# Patient Record
Sex: Female | Born: 2000 | Race: Black or African American | Hispanic: No | Marital: Single | Smoking: Never smoker
Health system: Southern US, Community
[De-identification: ages and names within clinical notes are randomized; demographics above are authoritative.]

---
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
## 2018-01-29 DIAGNOSIS — S70312A Abrasion, left thigh, initial encounter: Secondary | ICD-10-CM | POA: Diagnosis not present

## 2018-02-11 DIAGNOSIS — Z3042 Encounter for surveillance of injectable contraceptive: Secondary | ICD-10-CM | POA: Diagnosis not present

## 2018-02-11 DIAGNOSIS — Z113 Encounter for screening for infections with a predominantly sexual mode of transmission: Secondary | ICD-10-CM | POA: Diagnosis not present

## 2018-03-30 DIAGNOSIS — Z113 Encounter for screening for infections with a predominantly sexual mode of transmission: Secondary | ICD-10-CM | POA: Diagnosis not present

## 2018-04-02 DIAGNOSIS — K130 Diseases of lips: Secondary | ICD-10-CM | POA: Diagnosis not present

## 2018-04-02 DIAGNOSIS — T7840XA Allergy, unspecified, initial encounter: Secondary | ICD-10-CM | POA: Diagnosis not present

## 2018-04-02 DIAGNOSIS — H5789 Other specified disorders of eye and adnexa: Secondary | ICD-10-CM | POA: Diagnosis not present

## 2018-05-11 DIAGNOSIS — Z30013 Encounter for initial prescription of injectable contraceptive: Secondary | ICD-10-CM | POA: Diagnosis not present

## 2019-02-03 ENCOUNTER — Other Ambulatory Visit: Payer: Self-pay

## 2019-02-03 DIAGNOSIS — Z20828 Contact with and (suspected) exposure to other viral communicable diseases: Secondary | ICD-10-CM | POA: Diagnosis not present

## 2019-02-03 DIAGNOSIS — Z20822 Contact with and (suspected) exposure to covid-19: Secondary | ICD-10-CM

## 2019-02-04 LAB — NOVEL CORONAVIRUS, NAA: SARS-CoV-2, NAA: NOT DETECTED

## 2019-02-05 ENCOUNTER — Emergency Department (HOSPITAL_COMMUNITY)
Admission: EM | Admit: 2019-02-05 | Discharge: 2019-02-05 | Disposition: A | Discharge status: Home / Self Care | Payer: Medicaid Other | Attending: Emergency Medicine | Admitting: Emergency Medicine

## 2019-02-05 ENCOUNTER — Encounter (HOSPITAL_COMMUNITY): Payer: Self-pay

## 2019-02-05 ENCOUNTER — Other Ambulatory Visit: Payer: Self-pay

## 2019-02-05 DIAGNOSIS — Z041 Encounter for examination and observation following transport accident: Secondary | ICD-10-CM | POA: Insufficient documentation

## 2019-02-05 DIAGNOSIS — Y999 Unspecified external cause status: Secondary | ICD-10-CM | POA: Diagnosis not present

## 2019-02-05 DIAGNOSIS — Y92411 Interstate highway as the place of occurrence of the external cause: Secondary | ICD-10-CM | POA: Diagnosis not present

## 2019-02-05 DIAGNOSIS — Y9389 Activity, other specified: Secondary | ICD-10-CM | POA: Insufficient documentation

## 2019-02-05 DIAGNOSIS — M62838 Other muscle spasm: Secondary | ICD-10-CM | POA: Diagnosis not present

## 2019-02-05 MED ORDER — CYCLOBENZAPRINE HCL 10 MG PO TABS: 10.0000 mg | ORAL_TABLET | Freq: Two times a day (BID) | ORAL | 0 refills | Status: DC | PRN

## 2019-02-05 NOTE — Discharge Instructions (Addendum)
Please use Tylenol and ibuprofen for pain control.  Use your prescribed muscle relaxer for nighttime use as it will make you drowsy.  Do not mix with alcohol.  Please return for any new or concerning symptoms.  You may wish to follow-up with your primary care provider in the next 2 to 3 days to reassess her symptoms.
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
## 2019-02-15 DIAGNOSIS — Z113 Encounter for screening for infections with a predominantly sexual mode of transmission: Secondary | ICD-10-CM | POA: Diagnosis not present

## 2019-02-15 DIAGNOSIS — R300 Dysuria: Secondary | ICD-10-CM | POA: Diagnosis not present

## 2019-02-15 DIAGNOSIS — N760 Acute vaginitis: Secondary | ICD-10-CM | POA: Diagnosis not present

## 2019-02-18 DIAGNOSIS — Z20828 Contact with and (suspected) exposure to other viral communicable diseases: Secondary | ICD-10-CM | POA: Diagnosis not present

## 2019-03-01 DIAGNOSIS — Z20828 Contact with and (suspected) exposure to other viral communicable diseases: Secondary | ICD-10-CM | POA: Diagnosis not present

## 2019-03-03 DIAGNOSIS — Z20828 Contact with and (suspected) exposure to other viral communicable diseases: Secondary | ICD-10-CM | POA: Diagnosis not present

## 2019-04-12 DIAGNOSIS — Z114 Encounter for screening for human immunodeficiency virus [HIV]: Secondary | ICD-10-CM | POA: Diagnosis not present

## 2019-04-12 DIAGNOSIS — Z113 Encounter for screening for infections with a predominantly sexual mode of transmission: Secondary | ICD-10-CM | POA: Diagnosis not present

## 2019-05-24 ENCOUNTER — Encounter (HOSPITAL_COMMUNITY): Payer: Self-pay | Admitting: *Deleted

## 2019-05-24 ENCOUNTER — Emergency Department (HOSPITAL_COMMUNITY): Payer: Medicaid Other

## 2019-05-24 ENCOUNTER — Other Ambulatory Visit: Payer: Self-pay

## 2019-05-24 ENCOUNTER — Emergency Department (HOSPITAL_COMMUNITY)
Admission: EM | Admit: 2019-05-24 | Discharge: 2019-05-24 | Disposition: A | Discharge status: Home / Self Care | Payer: Medicaid Other | Attending: Emergency Medicine | Admitting: Emergency Medicine

## 2019-05-24 DIAGNOSIS — Z20822 Contact with and (suspected) exposure to covid-19: Secondary | ICD-10-CM | POA: Diagnosis not present

## 2019-05-24 DIAGNOSIS — R0789 Other chest pain: Secondary | ICD-10-CM | POA: Diagnosis not present

## 2019-05-24 DIAGNOSIS — R0602 Shortness of breath: Secondary | ICD-10-CM | POA: Diagnosis not present

## 2019-05-24 LAB — POC SARS CORONAVIRUS 2 AG -  ED: SARS Coronavirus 2 Ag: NEGATIVE

## 2019-05-24 MED ORDER — ACETAMINOPHEN 500 MG PO TABS
1000.0000 mg | ORAL_TABLET | Freq: Once | ORAL | Status: AC
  Administered 2019-05-24: 1000 mg via ORAL
  Filled 2019-05-24: qty 2

## 2019-05-24 MED ORDER — ALBUTEROL SULFATE HFA 108 (90 BASE) MCG/ACT IN AERS
2.0000 | INHALATION_SPRAY | Freq: Once | RESPIRATORY_TRACT | Status: AC
  Administered 2019-05-24: 12:00:00 2 via RESPIRATORY_TRACT
  Filled 2019-05-24: qty 6.7

## 2019-05-24 MED ORDER — ALBUTEROL SULFATE (2.5 MG/3ML) 0.083% IN NEBU: 5.0000 mg | INHALATION_SOLUTION | Freq: Once | RESPIRATORY_TRACT | Status: DC

## 2019-05-24 NOTE — ED Provider Notes (Signed)
[facility]-EMERGENCY DEPT Provider Note   CSN: [id number] Arrival date & time: 05/24/19  1139     History Chief Complaint  Patient presents with  . Shortness of Breath    Meghan Little is a 19 y.o. female.  Meghan Little is a 19 y.o. female who is otherwise healthy, presents to the emergency department for evaluation of shortness of breath.  She reports yesterday evening while at work she noticed some mild shortness of breath and chest tightness that was intermittent.  She was able to sleep through the night without difficulty, also noticed some sneezing and nasal congestion.  Has not noticed a cough.  Denies any chest pain or pleuritic pain.  No pain in her back.  No associated abdominal pain, nausea, vomiting or diaphoresis.  She has not had any fevers, but does report some chills and fatigue.  Denies body aches or weakness.  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  No meds prior to arrival to treat her symptoms, states that she has had to use an inhaler before but has never been formally diagnosed with asthma.  No other aggravating or alleviating factors.        History reviewed. No pertinent past medical history.  There are no problems to display for this patient.   History reviewed. No pertinent surgical history.   OB History   No obstetric history on file.     No family history on file.  Social History   Tobacco Use  . Smoking status: Never Smoker  . Smokeless tobacco: Never Used  Substance Use Topics  . Alcohol use: Yes    Comment: "occasional"  . Drug use: Never    Home Medications Prior to Admission medications   Medication Sig Start Date End Date Taking? Authorizing Provider  cyclobenzaprine (FLEXERIL) 10 MG tablet Take 1 tablet (10 mg total) by mouth 2 (two) times daily as needed for muscle spasms. 02/05/19   Gailen Shelter, PA    Allergies    Penicillins  Review of Systems   Review  of Systems  Constitutional: Positive for chills. Negative for fever.  HENT: Positive for congestion and sneezing. Negative for sore throat.   Respiratory: Positive for chest tightness and shortness of breath. Negative for cough.   Cardiovascular: Negative for chest pain.  Gastrointestinal: Negative for abdominal pain, nausea and vomiting.  Musculoskeletal: Negative for arthralgias and myalgias.  Skin: Negative for color change and rash.  Neurological: Negative for dizziness and light-headedness.  All other systems reviewed and are negative.   Physical Exam Updated Vital Signs BP 119/70 (BP Location: Right Arm)   Pulse 79   Temp 99.1 F (37.3 C) (Oral)   Resp 16   Ht 5\' 6"  (1.676 m)   Wt 54.9 kg   SpO2 100%   BMI 19.53 kg/m   Physical Exam Vitals and nursing note reviewed.  Constitutional:      General: She is not in acute distress.    Appearance: She is well-developed and normal weight. She is not ill-appearing or diaphoretic.  HENT:     Head: Normocephalic and atraumatic.     Nose: Congestion and rhinorrhea present.     Mouth/Throat:     Mouth: Mucous membranes are moist.     Pharynx: Oropharynx is clear. No oropharyngeal exudate or posterior oropharyngeal erythema.  Eyes:     General:        Right eye: No discharge.  Left eye: No discharge.     Pupils: Pupils are equal, round, and reactive to light.  Cardiovascular:     Rate and Rhythm: Normal rate and regular rhythm.     Heart sounds: Normal heart sounds. No murmur. No friction rub. No gallop.   Pulmonary:     Effort: Pulmonary effort is normal. No respiratory distress.     Breath sounds: Normal breath sounds. No wheezing or rales.     Comments: Respirations equal and unlabored, patient able to speak in full sentences, lungs clear to auscultation bilaterally Chest:     Chest wall: No tenderness.  Abdominal:     General: Bowel sounds are normal. There is no distension.     Palpations: Abdomen is soft. There  is no mass.     Tenderness: There is no abdominal tenderness. There is no guarding.     Comments: Abdomen soft, nondistended, nontender to palpation in all quadrants without guarding or peritoneal signs  Musculoskeletal:        General: No deformity.     Cervical back: Neck supple.     Right lower leg: No edema.     Left lower leg: No edema.  Skin:    General: Skin is warm and dry.     Capillary Refill: Capillary refill takes less than 2 seconds.  Neurological:     Mental Status: She is alert.     Coordination: Coordination normal.     Comments: Speech is clear, able to follow commands Moves extremities without ataxia, coordination intact  Psychiatric:        Mood and Affect: Mood normal.        Behavior: Behavior normal.     ED Results / Procedures / Treatments   Labs (all labs ordered are listed, but only abnormal results are displayed) Labs Reviewed  NOVEL CORONAVIRUS, NAA (HOSP ORDER, SEND-OUT TO REF LAB; TAT 18-24 HRS)  POC SARS CORONAVIRUS 2 AG -  ED    EKG EKG Interpretation  Date/Time:  Tuesday May 24 2019 11:53:31 EST Ventricular Rate:  86 PR Interval:    QRS Duration: 80 QT Interval:  340 QTC Calculation: 407 R Axis:   51 Text Interpretation: Sinus rhythm No old tracing to compare Confirmed by Lorre Nick ([id number]) on 05/25/2019 5:33:27 PM   Radiology DG Chest Port 1 View  Result Date: 05/24/2019 CLINICAL DATA:  Shortness of breath beginning yesterday. EXAM: PORTABLE CHEST 1 VIEW COMPARISON:  None. FINDINGS: The heart size and mediastinal contours are within normal limits. Both lungs are clear. The visualized skeletal structures are unremarkable. IMPRESSION: No active disease. Electronically Signed   By: Paulina Fusi M.D.   On: 05/24/2019 12:38    Procedures Procedures (including critical care time)  Medications Ordered in ED Medications  albuterol (VENTOLIN HFA) 108 (90 Base) MCG/ACT inhaler 2 puff (2 puffs Inhalation Given 05/24/19 1221)   acetaminophen (TYLENOL) tablet 1,000 mg (1,000 mg Oral Given 05/24/19 1220)    ED Course  I have reviewed the triage vital signs and the nursing notes.  Pertinent labs & imaging results that were available during my care of the patient were reviewed by me and considered in my medical decision making (see chart for details).    MDM Rules/Calculators/A&P                      19 year old female presents with some intermittent episodes of mild shortness of breath which began last night associated with some nasal congestion and  sneezing, no chest pain or cough noted, has had some chills but no fever.  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  No associated abdominal pain.  Patient denies any pleuritic pain, shortness of breath is not brought on by exertion.  Her EKG shows sinus rhythm with no concerning changes and her chest x-ray is clear.  Lungs are clear on exam.  Will test patient for Covid but I have low suspicion for PE as patient is PERC negative, no associated chest pain.  Patient's symptoms resolved with albuterol and Tylenol.  Rapid Covid was negative, PCR sent.  Discussed appropriate symptomatic treatment, home quarantine and return precautions, patient expresses understanding and agreement with plan.  Discharged home in good condition.  Meghan Little was evaluated in Emergency Department on 05/27/2019 for the symptoms described in the history of present illness. She was evaluated in the context of the global COVID-19 pandemic, which necessitated consideration that the patient might be at risk for infection with the SARS-CoV-2 virus that causes COVID-19. Institutional protocols and algorithms that pertain to the evaluation of patients at risk for COVID-19 are in a state of rapid change based on information released by regulatory bodies including the CDC and federal and state organizations. These policies and algorithms were followed during the patient's care in  the ED.  Final Clinical Impression(s) / ED Diagnoses Final diagnoses:  SOB (shortness of breath)  Encounter for laboratory testing for COVID-19 virus    Rx / DC Orders ED Discharge Orders    None       Janet Berlin 05/27/19 1131    Milton Ferguson, MD 05/30/19 [phone]

## 2019-05-24 NOTE — ED Triage Notes (Signed)
Yesterday noticed SHOB, today some sneezing,

## 2019-05-24 NOTE — Discharge Instructions (Signed)
Your chest x-ray is clear, your EKG looks good and your rapid Covid test was negative, you have a confirmatory Covid test pending and will be called in 2 to 3 days if results are positive, if results are negative they can be reviewed through your MyChart.  Please follow the instructions on your paperwork today to do this.  You should quarantine at home until you receive your test results.  I suspect your symptoms are related to a mild viral infection, you can use Tylenol, the albuterol inhaler you were given today, ibuprofen and over-the-counter cold and flu medications to treat your symptoms.  If you develop worsening shortness of breath, start experiencing chest pain or any other new or concerning symptoms please return to the ED.

## 2019-06-07 DIAGNOSIS — Z113 Encounter for screening for infections with a predominantly sexual mode of transmission: Secondary | ICD-10-CM | POA: Diagnosis not present

## 2019-08-08 DIAGNOSIS — N760 Acute vaginitis: Secondary | ICD-10-CM | POA: Diagnosis not present

## 2019-09-30 DIAGNOSIS — Z114 Encounter for screening for human immunodeficiency virus [HIV]: Secondary | ICD-10-CM | POA: Diagnosis not present

## 2019-09-30 DIAGNOSIS — Z113 Encounter for screening for infections with a predominantly sexual mode of transmission: Secondary | ICD-10-CM | POA: Diagnosis not present

## 2019-10-21 ENCOUNTER — Other Ambulatory Visit: Payer: Self-pay

## 2019-10-21 ENCOUNTER — Encounter (HOSPITAL_COMMUNITY): Payer: Self-pay

## 2019-10-21 ENCOUNTER — Ambulatory Visit (INDEPENDENT_AMBULATORY_CARE_PROVIDER_SITE_OTHER): Payer: Medicaid Other

## 2019-10-21 ENCOUNTER — Ambulatory Visit (HOSPITAL_COMMUNITY)
Admission: EM | Admit: 2019-10-21 | Discharge: 2019-10-21 | Disposition: A | Discharge status: Home / Self Care | Payer: Medicaid Other | Attending: Emergency Medicine | Admitting: Emergency Medicine

## 2019-10-21 DIAGNOSIS — S60221A Contusion of right hand, initial encounter: Secondary | ICD-10-CM | POA: Diagnosis not present

## 2019-10-21 DIAGNOSIS — M79641 Pain in right hand: Secondary | ICD-10-CM | POA: Diagnosis not present

## 2019-10-21 MED ORDER — IBUPROFEN 800 MG PO TABS: 800.0000 mg | ORAL_TABLET | Freq: Three times a day (TID) | ORAL | 0 refills | Status: AC

## 2019-10-21 NOTE — ED Triage Notes (Addendum)
Pt presents with right thumb and wrist pain after "hiiting something last night". Pt concerned for sprain. Pt noted to have full ROM in thumb and wrist with pain. Right hand non swollen. Pt denies OTC treatment or relieving factors. Pt noted to be using cell phone with affected hand.

## 2019-10-21 NOTE — ED Provider Notes (Signed)
[facility]    CSN: [id number] Arrival date & time: 10/21/19  1230      History   Chief Complaint Chief Complaint  Patient presents with  . Hand Pain    HPI Meghan Little is a 19 y.o. female significant past medical history presenting today for evaluation of right hand injury.  Patient reports that she had computer screen last night and since has had pain to her first through third fingers along with MCP joints.  Denies significant wrist pain.  Denies prior fractures.  HPI  History reviewed. No pertinent past medical history.  There are no problems to display for this patient.   History reviewed. No pertinent surgical history.  OB History   No obstetric history on file.      Home Medications    Prior to Admission medications   Medication Sig Start Date End Date Taking? Authorizing Provider  ibuprofen (ADVIL) 800 MG tablet Take 1 tablet (800 mg total) by mouth 3 (three) times daily. 10/21/19   Harutyun Monteverde, Elesa Hacker, PA-C    Family History History reviewed. No pertinent family history.  Social History Social History   Tobacco Use  . Smoking status: Never Smoker  . Smokeless tobacco: Never Used  Substance Use Topics  . Alcohol use: Yes    Comment: "occasional"  . Drug use: Never     Allergies   Penicillins   Review of Systems Review of Systems  Constitutional: Negative for fatigue and fever.  Eyes: Negative for visual disturbance.  Respiratory: Negative for shortness of breath.   Cardiovascular: Negative for chest pain.  Gastrointestinal: Negative for abdominal pain, nausea and vomiting.  Musculoskeletal: Positive for arthralgias and joint swelling.  Skin: Negative for color change, rash and wound.  Neurological: Negative for dizziness, weakness, light-headedness and headaches.     Physical Exam Triage Vital Signs ED Triage Vitals  Enc Vitals Group     BP 10/21/19 1239 121/74     Pulse Rate 10/21/19 1239 88     Resp 10/21/19 1239 16      Temp 10/21/19 1239 98.2 F (36.8 C)     Temp Source 10/21/19 1239 Oral     SpO2 10/21/19 1239 100 %     Weight --      Height --      Head Circumference --      Peak Flow --      Pain Score 10/21/19 1241 5     Pain Loc --      Pain Edu? --      Excl. in [place]? --    No data found.  Updated Vital Signs BP 121/74 (BP Location: Right Arm)   Pulse 88   Temp 98.2 F (36.8 C) (Oral)   Resp 16   LMP 08/27/2019 (Approximate)   SpO2 100%   Visual Acuity Right Eye Distance:   Left Eye Distance:   Bilateral Distance:    Right Eye Near:   Left Eye Near:    Bilateral Near:     Physical Exam Vitals and nursing note reviewed.  Constitutional:      Appearance: She is well-developed.     Comments: No acute distress  HENT:     Head: Normocephalic and atraumatic.     Nose: Nose normal.  Eyes:     Conjunctiva/sclera: Conjunctivae normal.  Cardiovascular:     Rate and Rhythm: Normal rate.  Pulmonary:     Effort: Pulmonary effort is normal. No respiratory distress.  Abdominal:  General: There is no distension.  Musculoskeletal:        General: Normal range of motion.     Cervical back: Neck supple.     Comments: Right hand: No obvious swelling discoloration or deformity noted to hand, tender to palpation to first MCP joint and to interphalangeal joint, mild tenderness to palpation of distal second and third metacarpals, full active range of motion of all fingers  Nontender to palpation of distal radius and ulna, radial pulse 2+  Skin:    General: Skin is warm and dry.  Neurological:     Mental Status: She is alert and oriented to person, place, and time.      UC Treatments / Results  Labs (all labs ordered are listed, but only abnormal results are displayed) Labs Reviewed - No data to display  EKG   Radiology DG Hand Complete Right  Result Date: 10/21/2019 CLINICAL DATA:  Acute right hand pain after injury. EXAM: RIGHT HAND - COMPLETE 3+ VIEW COMPARISON:  None.  FINDINGS: There is no evidence of fracture or dislocation. There is no evidence of arthropathy or other focal bone abnormality. Soft tissues are unremarkable. IMPRESSION: Negative. Electronically Signed   By: Lupita Raider M.D.   On: 10/21/2019 13:36    Procedures Procedures (including critical care time)  Medications Ordered in UC Medications - No data to display  Initial Impression / Assessment and Plan / UC Course  I have reviewed the triage vital signs and the nursing notes.  Pertinent labs & imaging results that were available during my care of the patient were reviewed by me and considered in my medical decision making (see chart for details).     X-ray negative for acute bony abnormality.  Suspect most likely sprain/soft tissue swelling and contusion.  Ace wrap applied.  Anti-inflammatories ice and rest.  Monitor for gradual improvement.  Discussed strict return precautions. Patient verbalized understanding and is agreeable with plan.  Final Clinical Impressions(s) / UC Diagnoses   Final diagnoses:  Contusion of right hand, initial encounter     Discharge Instructions     NO fracture Use anti-inflammatories for pain/swelling. You may take up to 800 mg Ibuprofen every 8 hours with food. You may supplement Ibuprofen with Tylenol [phone] mg every 8 hours.  Ice hand  Follow up for any concerns    ED Prescriptions    Medication Sig Dispense Auth. Provider   ibuprofen (ADVIL) 800 MG tablet Take 1 tablet (800 mg total) by mouth 3 (three) times daily. 21 tablet Juron Vorhees, [place] C, PA-C     PDMP not reviewed this encounter.   Lew Dawes, [place] 10/21/19 1347

## 2019-10-21 NOTE — Discharge Instructions (Signed)
NO fracture Use anti-inflammatories for pain/swelling. You may take up to 800 mg Ibuprofen every 8 hours with food. You may supplement Ibuprofen with Tylenol [phone] mg every 8 hours.  Ice hand  Follow up for any concerns

## 2019-12-02 DIAGNOSIS — Z113 Encounter for screening for infections with a predominantly sexual mode of transmission: Secondary | ICD-10-CM | POA: Diagnosis not present

## 2020-02-04 DIAGNOSIS — S6991XA Unspecified injury of right wrist, hand and finger(s), initial encounter: Secondary | ICD-10-CM | POA: Diagnosis not present

## 2020-02-04 DIAGNOSIS — S51011A Laceration without foreign body of right elbow, initial encounter: Secondary | ICD-10-CM | POA: Diagnosis not present

## 2020-02-04 DIAGNOSIS — X838XXA Intentional self-harm by other specified means, initial encounter: Secondary | ICD-10-CM | POA: Diagnosis not present

## 2020-02-04 DIAGNOSIS — S59911A Unspecified injury of right forearm, initial encounter: Secondary | ICD-10-CM | POA: Diagnosis not present

## 2020-02-04 DIAGNOSIS — S60511A Abrasion of right hand, initial encounter: Secondary | ICD-10-CM | POA: Diagnosis not present

## 2020-02-04 DIAGNOSIS — S61511A Laceration without foreign body of right wrist, initial encounter: Secondary | ICD-10-CM | POA: Diagnosis not present

## 2020-02-04 DIAGNOSIS — S59901A Unspecified injury of right elbow, initial encounter: Secondary | ICD-10-CM | POA: Diagnosis not present

## 2020-02-10 DIAGNOSIS — Z114 Encounter for screening for human immunodeficiency virus [HIV]: Secondary | ICD-10-CM | POA: Diagnosis not present

## 2020-02-10 DIAGNOSIS — Z113 Encounter for screening for infections with a predominantly sexual mode of transmission: Secondary | ICD-10-CM | POA: Diagnosis not present

## 2020-02-23 ENCOUNTER — Emergency Department (HOSPITAL_COMMUNITY)
Admission: EM | Admit: 2020-02-23 | Discharge: 2020-02-23 | Disposition: A | Discharge status: Home / Self Care | Payer: Medicaid Other | Attending: Emergency Medicine | Admitting: Emergency Medicine

## 2020-02-23 ENCOUNTER — Encounter (HOSPITAL_COMMUNITY): Payer: Self-pay

## 2020-02-23 ENCOUNTER — Other Ambulatory Visit: Payer: Self-pay

## 2020-02-23 DIAGNOSIS — S51011D Laceration without foreign body of right elbow, subsequent encounter: Secondary | ICD-10-CM | POA: Insufficient documentation

## 2020-02-23 DIAGNOSIS — X58XXXD Exposure to other specified factors, subsequent encounter: Secondary | ICD-10-CM | POA: Insufficient documentation

## 2020-02-23 DIAGNOSIS — Z4802 Encounter for removal of sutures: Secondary | ICD-10-CM | POA: Insufficient documentation

## 2020-02-23 NOTE — ED Provider Notes (Signed)
[facility]-EMERGENCY DEPT Provider Note   CSN: [id number] Arrival date & time: 02/23/20  2047     History Chief Complaint  Patient presents with  . Suture / Staple Removal    Meghan Little is a 19 y.o. female who presents to the ED today for suture removal. Pt was seen in the ED on 10/09 for laceration to R elbow after hitting a mirror. She was told to get them removed in 12-14 days. Pt states she did not have time to get them removed until today. The wound has been doing well; no drainage. No fevers or chills. No other complaints at this time.   The history is provided by the patient and medical records.       History reviewed. No pertinent past medical history.  There are no problems to display for this patient.   History reviewed. No pertinent surgical history.   OB History   No obstetric history on file.     No family history on file.  Social History   Tobacco Use  . Smoking status: Never Smoker  . Smokeless tobacco: Never Used  Substance Use Topics  . Alcohol use: Yes    Comment: "occasional"  . Drug use: Never    Home Medications Prior to Admission medications   Medication Sig Start Date End Date Taking? Authorizing Provider  ibuprofen (ADVIL) 800 MG tablet Take 1 tablet (800 mg total) by mouth 3 (three) times daily. 10/21/19   Wieters, Hallie C, PA-C    Allergies    Penicillins and Metronidazole  Review of Systems   Review of Systems  Constitutional: Negative for chills and fever.  Skin: Negative for color change.    Physical Exam Updated Vital Signs BP 112/74 (BP Location: Right Arm)   Pulse 77   Temp 98.4 F (36.9 C) (Oral)   Resp 16   Ht 5\' 6"  (1.676 m)   Wt 58.5 kg   SpO2 100%   BMI 20.82 kg/m   Physical Exam Vitals and nursing note reviewed.  Constitutional:      Appearance: She is not ill-appearing.  HENT:     Head: Normocephalic and atraumatic.  Eyes:     Conjunctiva/sclera: Conjunctivae normal.    Cardiovascular:     Rate and Rhythm: Normal rate and regular rhythm.  Pulmonary:     Effort: Pulmonary effort is normal.     Breath sounds: Normal breath sounds.  Musculoskeletal:     Comments: Healing laceration to R elbow with 3 sutures in place; no erythema, edema, or drainage. ROM intact to elbow without TTP. NVI.   Skin:    General: Skin is warm and dry.     Coloration: Skin is not jaundiced.  Neurological:     Mental Status: She is alert.     ED Results / Procedures / Treatments   Labs (all labs ordered are listed, but only abnormal results are displayed) Labs Reviewed - No data to display  EKG None  Radiology No results found.  Procedures .Suture Removal  Date/Time: 02/23/2020 9:25 PM Performed by: 02/25/2020, PA-C Authorized by: Tanda Rockers, PA-C   Consent:    Consent obtained:  Verbal   Consent given by:  Patient   Risks discussed:  Bleeding, pain and wound separation Location:    Location:  Upper extremity   Upper extremity location:  Elbow   Elbow location:  R elbow Procedure details:    Wound appearance:  No signs of infection   Number  of sutures removed:  3 Post-procedure details:    Post-removal:  No dressing applied   Patient tolerance of procedure:  Tolerated well, no immediate complications   (including critical care time)  Medications Ordered in ED Medications - No data to display  ED Course  I have reviewed the triage vital signs and the nursing notes.  Pertinent labs & imaging results that were available during my care of the patient were reviewed by me and considered in my medical decision making (see chart for details).    MDM Rules/Calculators/A&P                          19 year old female presenting to the ED today for suture removal. Had sutures placed on 10/09 after hitting a mirror with her elbow. No complaints at this time. No signs of infection today to wound. Sutures removed without difficulty and pt discharged  home.   This note was prepared using Dragon voice recognition software and may include unintentional dictation errors due to the inherent limitations of voice recognition software.  Final Clinical Impression(s) / ED Diagnoses Final diagnoses:  Visit for suture removal    Rx / DC Orders ED Discharge Orders    None       Discharge Instructions     Please see attached information regarding wound care after getting sutures removed.  You can apply neosporin to the area to help with healing Follow up with your PCP regarding your ED visit       Tanda Rockers, Cordelia Poche 02/23/20 2126    Geoffery Lyons, MD 02/24/20 1622

## 2020-02-23 NOTE — Discharge Instructions (Addendum)
Please see attached information regarding wound care after getting sutures removed.  You can apply neosporin to the area to help with healing Follow up with your PCP regarding your ED visit

## 2020-02-23 NOTE — ED Triage Notes (Signed)
Pt sts needing 3 sutures removed from right elbow.

## 2020-03-07 DIAGNOSIS — N760 Acute vaginitis: Secondary | ICD-10-CM | POA: Diagnosis not present

## 2020-03-07 DIAGNOSIS — Z113 Encounter for screening for infections with a predominantly sexual mode of transmission: Secondary | ICD-10-CM | POA: Diagnosis not present

## 2020-04-16 DIAGNOSIS — Z113 Encounter for screening for infections with a predominantly sexual mode of transmission: Secondary | ICD-10-CM | POA: Diagnosis not present

## 2020-05-21 IMAGING — DX DG CHEST 1V PORT
1 series · 1 of 1 positions shown · non-contrast
Comparison: None.

CLINICAL DATA: Shortness of breath beginning yesterday.

EXAM:
PORTABLE CHEST 1 VIEW

[chest ap]
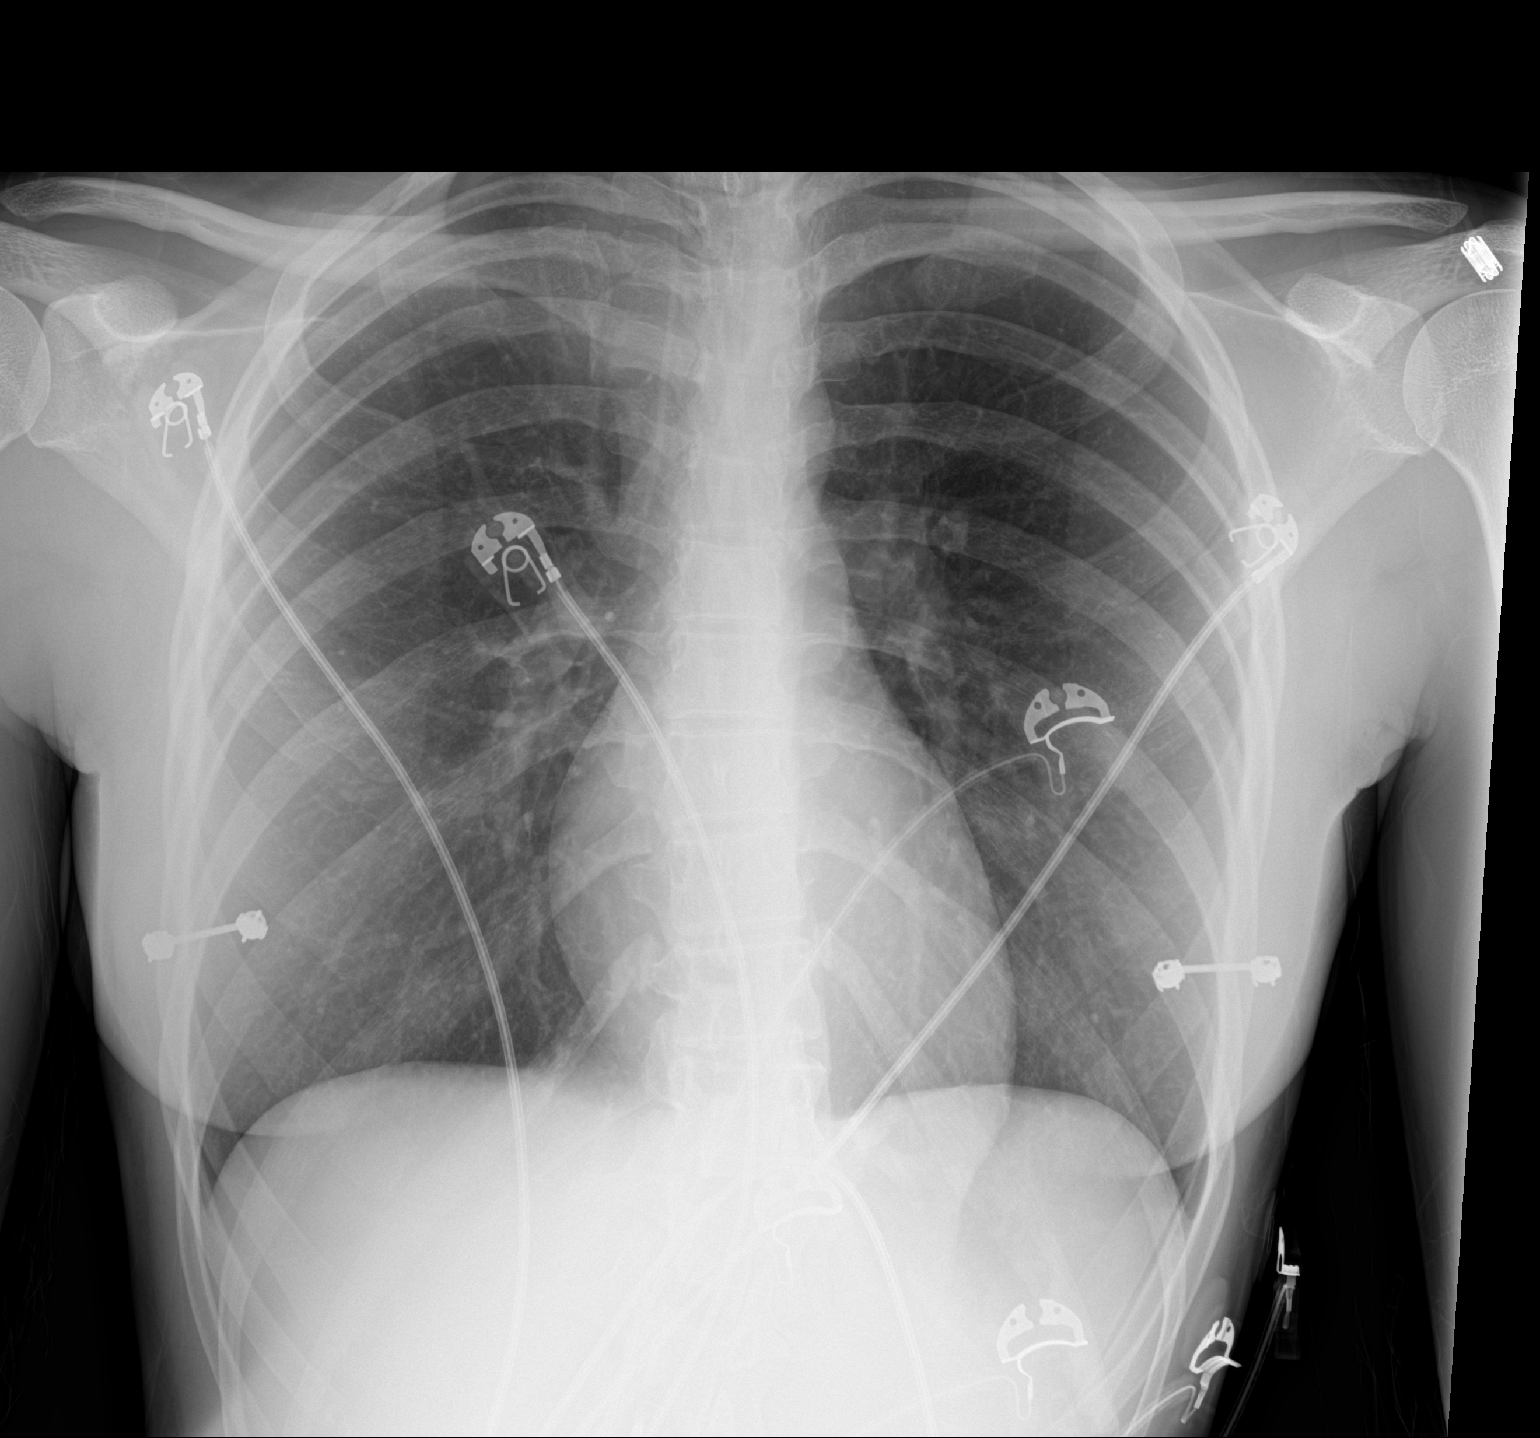

[1 of 1 positions shown; findings below may reference images not displayed]

FINDINGS: The heart size and mediastinal contours are within normal limits.
Both lungs are clear. The visualized skeletal structures are
unremarkable.
IMPRESSION: No active disease.

## 2020-06-15 DIAGNOSIS — R309 Painful micturition, unspecified: Secondary | ICD-10-CM | POA: Diagnosis not present

## 2020-07-03 DIAGNOSIS — Z113 Encounter for screening for infections with a predominantly sexual mode of transmission: Secondary | ICD-10-CM | POA: Diagnosis not present

## 2020-08-02 DIAGNOSIS — N898 Other specified noninflammatory disorders of vagina: Secondary | ICD-10-CM | POA: Diagnosis not present

## 2020-08-07 DIAGNOSIS — N760 Acute vaginitis: Secondary | ICD-10-CM | POA: Diagnosis not present

## 2020-09-03 DIAGNOSIS — B9689 Other specified bacterial agents as the cause of diseases classified elsewhere: Secondary | ICD-10-CM | POA: Diagnosis not present

## 2020-09-03 DIAGNOSIS — N760 Acute vaginitis: Secondary | ICD-10-CM | POA: Diagnosis not present

## 2020-09-03 DIAGNOSIS — Z113 Encounter for screening for infections with a predominantly sexual mode of transmission: Secondary | ICD-10-CM | POA: Diagnosis not present

## 2020-09-04 DIAGNOSIS — Z113 Encounter for screening for infections with a predominantly sexual mode of transmission: Secondary | ICD-10-CM | POA: Diagnosis not present

## 2020-09-26 DIAGNOSIS — Z113 Encounter for screening for infections with a predominantly sexual mode of transmission: Secondary | ICD-10-CM | POA: Diagnosis not present

## 2020-10-27 DIAGNOSIS — B373 Candidiasis of vulva and vagina: Secondary | ICD-10-CM | POA: Diagnosis not present

## 2020-11-10 DIAGNOSIS — S91201A Unspecified open wound of right great toe with damage to nail, initial encounter: Secondary | ICD-10-CM | POA: Diagnosis not present

## 2020-11-10 DIAGNOSIS — Z880 Allergy status to penicillin: Secondary | ICD-10-CM | POA: Diagnosis not present

## 2020-12-08 DIAGNOSIS — Z3A01 Less than 8 weeks gestation of pregnancy: Secondary | ICD-10-CM | POA: Diagnosis not present

## 2020-12-08 DIAGNOSIS — Z113 Encounter for screening for infections with a predominantly sexual mode of transmission: Secondary | ICD-10-CM | POA: Diagnosis not present

## 2020-12-08 DIAGNOSIS — N760 Acute vaginitis: Secondary | ICD-10-CM | POA: Diagnosis not present

## 2021-01-22 DIAGNOSIS — N760 Acute vaginitis: Secondary | ICD-10-CM | POA: Diagnosis not present

## 2021-01-22 DIAGNOSIS — Z113 Encounter for screening for infections with a predominantly sexual mode of transmission: Secondary | ICD-10-CM | POA: Diagnosis not present

## 2021-02-06 DIAGNOSIS — N898 Other specified noninflammatory disorders of vagina: Secondary | ICD-10-CM | POA: Diagnosis not present

## 2021-02-27 DIAGNOSIS — Z113 Encounter for screening for infections with a predominantly sexual mode of transmission: Secondary | ICD-10-CM | POA: Diagnosis not present

## 2021-02-27 DIAGNOSIS — Z114 Encounter for screening for human immunodeficiency virus [HIV]: Secondary | ICD-10-CM | POA: Diagnosis not present

## 2021-03-11 DIAGNOSIS — R0789 Other chest pain: Secondary | ICD-10-CM | POA: Diagnosis not present

## 2021-04-03 DIAGNOSIS — Z113 Encounter for screening for infections with a predominantly sexual mode of transmission: Secondary | ICD-10-CM | POA: Diagnosis not present

## 2021-04-03 DIAGNOSIS — N760 Acute vaginitis: Secondary | ICD-10-CM | POA: Diagnosis not present

## 2021-05-06 DIAGNOSIS — N898 Other specified noninflammatory disorders of vagina: Secondary | ICD-10-CM | POA: Diagnosis not present

## 2021-05-06 DIAGNOSIS — N920 Excessive and frequent menstruation with regular cycle: Secondary | ICD-10-CM | POA: Diagnosis not present

## 2021-05-21 DIAGNOSIS — N760 Acute vaginitis: Secondary | ICD-10-CM | POA: Diagnosis not present

## 2021-05-21 DIAGNOSIS — Z113 Encounter for screening for infections with a predominantly sexual mode of transmission: Secondary | ICD-10-CM | POA: Diagnosis not present

## 2021-05-29 DIAGNOSIS — Z419 Encounter for procedure for purposes other than remedying health state, unspecified: Secondary | ICD-10-CM | POA: Diagnosis not present

## 2021-06-26 DIAGNOSIS — Z419 Encounter for procedure for purposes other than remedying health state, unspecified: Secondary | ICD-10-CM | POA: Diagnosis not present

## 2021-07-27 DIAGNOSIS — Z419 Encounter for procedure for purposes other than remedying health state, unspecified: Secondary | ICD-10-CM | POA: Diagnosis not present

## 2021-07-29 DIAGNOSIS — Z3202 Encounter for pregnancy test, result negative: Secondary | ICD-10-CM | POA: Diagnosis not present

## 2021-07-29 DIAGNOSIS — N760 Acute vaginitis: Secondary | ICD-10-CM | POA: Diagnosis not present

## 2021-07-29 DIAGNOSIS — N898 Other specified noninflammatory disorders of vagina: Secondary | ICD-10-CM | POA: Diagnosis not present

## 2021-08-09 DIAGNOSIS — R0789 Other chest pain: Secondary | ICD-10-CM | POA: Diagnosis not present

## 2021-08-09 DIAGNOSIS — F332 Major depressive disorder, recurrent severe without psychotic features: Secondary | ICD-10-CM | POA: Diagnosis not present

## 2021-08-09 DIAGNOSIS — Z1331 Encounter for screening for depression: Secondary | ICD-10-CM | POA: Diagnosis not present

## 2021-08-09 DIAGNOSIS — Z889 Allergy status to unspecified drugs, medicaments and biological substances status: Secondary | ICD-10-CM | POA: Diagnosis not present

## 2021-08-26 DIAGNOSIS — Z419 Encounter for procedure for purposes other than remedying health state, unspecified: Secondary | ICD-10-CM | POA: Diagnosis not present

## 2021-09-09 DIAGNOSIS — Z113 Encounter for screening for infections with a predominantly sexual mode of transmission: Secondary | ICD-10-CM | POA: Diagnosis not present

## 2021-09-26 DIAGNOSIS — Z419 Encounter for procedure for purposes other than remedying health state, unspecified: Secondary | ICD-10-CM | POA: Diagnosis not present

## 2021-10-26 DIAGNOSIS — Z419 Encounter for procedure for purposes other than remedying health state, unspecified: Secondary | ICD-10-CM | POA: Diagnosis not present

## 2021-11-08 DIAGNOSIS — N898 Other specified noninflammatory disorders of vagina: Secondary | ICD-10-CM | POA: Diagnosis not present

## 2021-11-08 DIAGNOSIS — R300 Dysuria: Secondary | ICD-10-CM | POA: Diagnosis not present

## 2021-11-26 DIAGNOSIS — Z419 Encounter for procedure for purposes other than remedying health state, unspecified: Secondary | ICD-10-CM | POA: Diagnosis not present

## 2021-12-10 DIAGNOSIS — N898 Other specified noninflammatory disorders of vagina: Secondary | ICD-10-CM | POA: Diagnosis not present

## 2021-12-10 DIAGNOSIS — Z3202 Encounter for pregnancy test, result negative: Secondary | ICD-10-CM | POA: Diagnosis not present

## 2021-12-10 DIAGNOSIS — N760 Acute vaginitis: Secondary | ICD-10-CM | POA: Diagnosis not present

## 2021-12-27 DIAGNOSIS — Z419 Encounter for procedure for purposes other than remedying health state, unspecified: Secondary | ICD-10-CM | POA: Diagnosis not present

## 2022-01-06 DIAGNOSIS — R1030 Lower abdominal pain, unspecified: Secondary | ICD-10-CM | POA: Diagnosis not present

## 2022-01-15 DIAGNOSIS — B3731 Acute candidiasis of vulva and vagina: Secondary | ICD-10-CM | POA: Diagnosis not present

## 2022-01-15 DIAGNOSIS — Z113 Encounter for screening for infections with a predominantly sexual mode of transmission: Secondary | ICD-10-CM | POA: Diagnosis not present

## 2022-01-26 DIAGNOSIS — Z419 Encounter for procedure for purposes other than remedying health state, unspecified: Secondary | ICD-10-CM | POA: Diagnosis not present

## 2022-02-11 DIAGNOSIS — B379 Candidiasis, unspecified: Secondary | ICD-10-CM | POA: Diagnosis not present

## 2022-02-11 DIAGNOSIS — Z3202 Encounter for pregnancy test, result negative: Secondary | ICD-10-CM | POA: Diagnosis not present

## 2022-02-11 DIAGNOSIS — N760 Acute vaginitis: Secondary | ICD-10-CM | POA: Diagnosis not present

## 2022-02-17 DIAGNOSIS — N760 Acute vaginitis: Secondary | ICD-10-CM | POA: Diagnosis not present

## 2022-02-17 DIAGNOSIS — T3695XA Adverse effect of unspecified systemic antibiotic, initial encounter: Secondary | ICD-10-CM | POA: Diagnosis not present

## 2022-02-17 DIAGNOSIS — B9689 Other specified bacterial agents as the cause of diseases classified elsewhere: Secondary | ICD-10-CM | POA: Diagnosis not present

## 2022-02-17 DIAGNOSIS — B379 Candidiasis, unspecified: Secondary | ICD-10-CM | POA: Diagnosis not present

## 2022-02-26 DIAGNOSIS — Z419 Encounter for procedure for purposes other than remedying health state, unspecified: Secondary | ICD-10-CM | POA: Diagnosis not present

## 2022-03-10 DIAGNOSIS — N898 Other specified noninflammatory disorders of vagina: Secondary | ICD-10-CM | POA: Diagnosis not present

## 2022-03-28 DIAGNOSIS — Z419 Encounter for procedure for purposes other than remedying health state, unspecified: Secondary | ICD-10-CM | POA: Diagnosis not present

## 2022-04-11 DIAGNOSIS — Z113 Encounter for screening for infections with a predominantly sexual mode of transmission: Secondary | ICD-10-CM | POA: Diagnosis not present

## 2022-04-27 DIAGNOSIS — T7840XA Allergy, unspecified, initial encounter: Secondary | ICD-10-CM | POA: Diagnosis not present

## 2022-04-27 DIAGNOSIS — K130 Diseases of lips: Secondary | ICD-10-CM | POA: Diagnosis not present

## 2022-04-28 DIAGNOSIS — Z419 Encounter for procedure for purposes other than remedying health state, unspecified: Secondary | ICD-10-CM | POA: Diagnosis not present

## 2022-05-29 DIAGNOSIS — Z419 Encounter for procedure for purposes other than remedying health state, unspecified: Secondary | ICD-10-CM | POA: Diagnosis not present

## 2022-06-05 DIAGNOSIS — Z113 Encounter for screening for infections with a predominantly sexual mode of transmission: Secondary | ICD-10-CM | POA: Diagnosis not present

## 2022-06-27 DIAGNOSIS — Z419 Encounter for procedure for purposes other than remedying health state, unspecified: Secondary | ICD-10-CM | POA: Diagnosis not present

## 2022-07-28 DIAGNOSIS — Z419 Encounter for procedure for purposes other than remedying health state, unspecified: Secondary | ICD-10-CM | POA: Diagnosis not present

## 2022-07-29 DIAGNOSIS — N898 Other specified noninflammatory disorders of vagina: Secondary | ICD-10-CM | POA: Diagnosis not present

## 2022-07-29 DIAGNOSIS — N760 Acute vaginitis: Secondary | ICD-10-CM | POA: Diagnosis not present

## 2022-07-29 DIAGNOSIS — Z3202 Encounter for pregnancy test, result negative: Secondary | ICD-10-CM | POA: Diagnosis not present

## 2022-07-29 DIAGNOSIS — Z113 Encounter for screening for infections with a predominantly sexual mode of transmission: Secondary | ICD-10-CM | POA: Diagnosis not present

## 2022-08-27 ENCOUNTER — Telehealth: Payer: Self-pay

## 2022-08-27 DIAGNOSIS — Z113 Encounter for screening for infections with a predominantly sexual mode of transmission: Secondary | ICD-10-CM | POA: Diagnosis not present

## 2022-08-27 DIAGNOSIS — Z419 Encounter for procedure for purposes other than remedying health state, unspecified: Secondary | ICD-10-CM | POA: Diagnosis not present

## 2022-08-27 DIAGNOSIS — N3001 Acute cystitis with hematuria: Secondary | ICD-10-CM | POA: Diagnosis not present

## 2022-08-27 NOTE — Telephone Encounter (Signed)
Sending mychart msg. AS, CMA 

## 2022-08-27 NOTE — Telephone Encounter (Signed)
Sending mychart msg

## 2022-09-10 DIAGNOSIS — N760 Acute vaginitis: Secondary | ICD-10-CM | POA: Diagnosis not present

## 2022-09-10 DIAGNOSIS — Z3202 Encounter for pregnancy test, result negative: Secondary | ICD-10-CM | POA: Diagnosis not present

## 2022-09-25 DIAGNOSIS — Z113 Encounter for screening for infections with a predominantly sexual mode of transmission: Secondary | ICD-10-CM | POA: Diagnosis not present

## 2022-09-27 DIAGNOSIS — Z419 Encounter for procedure for purposes other than remedying health state, unspecified: Secondary | ICD-10-CM | POA: Diagnosis not present

## 2022-10-27 DIAGNOSIS — Z419 Encounter for procedure for purposes other than remedying health state, unspecified: Secondary | ICD-10-CM | POA: Diagnosis not present

## 2022-11-04 DIAGNOSIS — Z113 Encounter for screening for infections with a predominantly sexual mode of transmission: Secondary | ICD-10-CM | POA: Diagnosis not present

## 2022-11-04 DIAGNOSIS — N898 Other specified noninflammatory disorders of vagina: Secondary | ICD-10-CM | POA: Diagnosis not present

## 2022-11-27 DIAGNOSIS — Z419 Encounter for procedure for purposes other than remedying health state, unspecified: Secondary | ICD-10-CM | POA: Diagnosis not present

## 2022-12-08 DIAGNOSIS — N760 Acute vaginitis: Secondary | ICD-10-CM | POA: Diagnosis not present

## 2022-12-28 DIAGNOSIS — Z419 Encounter for procedure for purposes other than remedying health state, unspecified: Secondary | ICD-10-CM | POA: Diagnosis not present

## 2023-01-23 DIAGNOSIS — J029 Acute pharyngitis, unspecified: Secondary | ICD-10-CM | POA: Diagnosis not present

## 2023-01-23 DIAGNOSIS — Z113 Encounter for screening for infections with a predominantly sexual mode of transmission: Secondary | ICD-10-CM | POA: Diagnosis not present

## 2023-01-25 DIAGNOSIS — Z113 Encounter for screening for infections with a predominantly sexual mode of transmission: Secondary | ICD-10-CM | POA: Diagnosis not present

## 2023-01-27 DIAGNOSIS — Z419 Encounter for procedure for purposes other than remedying health state, unspecified: Secondary | ICD-10-CM | POA: Diagnosis not present

## 2023-02-06 DIAGNOSIS — N898 Other specified noninflammatory disorders of vagina: Secondary | ICD-10-CM | POA: Diagnosis not present

## 2023-02-06 DIAGNOSIS — Z113 Encounter for screening for infections with a predominantly sexual mode of transmission: Secondary | ICD-10-CM | POA: Diagnosis not present

## 2023-02-27 DIAGNOSIS — Z419 Encounter for procedure for purposes other than remedying health state, unspecified: Secondary | ICD-10-CM | POA: Diagnosis not present
